# Patient Record
Sex: Male | Born: 1970 | Hispanic: Yes | Smoking: Never smoker
Health system: Southern US, Community
[De-identification: ages and names within clinical notes are randomized; demographics above are authoritative.]

## PROBLEM LIST (undated history)

## (undated) HISTORY — PX: NO PAST SURGERIES: SHX2092

---

## 2015-03-14 ENCOUNTER — Observation Stay (HOSPITAL_BASED_OUTPATIENT_CLINIC_OR_DEPARTMENT_OTHER)
Admission: EM | Admit: 2015-03-14 | Discharge: 2015-03-15 | Disposition: A | Discharge status: Home / Self Care | Payer: Self-pay | Source: Home / Self Care | Attending: Emergency Medicine | Admitting: Emergency Medicine

## 2015-03-14 ENCOUNTER — Emergency Department (HOSPITAL_COMMUNITY): Payer: Self-pay

## 2015-03-14 ENCOUNTER — Encounter (HOSPITAL_COMMUNITY): Payer: Self-pay | Admitting: Nurse Practitioner

## 2015-03-14 DIAGNOSIS — R0789 Other chest pain: Secondary | ICD-10-CM | POA: Insufficient documentation

## 2015-03-14 DIAGNOSIS — R9431 Abnormal electrocardiogram [ECG] [EKG]: Secondary | ICD-10-CM | POA: Insufficient documentation

## 2015-03-14 DIAGNOSIS — Z791 Long term (current) use of non-steroidal anti-inflammatories (NSAID): Secondary | ICD-10-CM

## 2015-03-14 DIAGNOSIS — R079 Chest pain, unspecified: Secondary | ICD-10-CM | POA: Diagnosis present

## 2015-03-14 DIAGNOSIS — R030 Elevated blood-pressure reading, without diagnosis of hypertension: Secondary | ICD-10-CM

## 2015-03-14 DIAGNOSIS — Z7982 Long term (current) use of aspirin: Secondary | ICD-10-CM

## 2015-03-14 LAB — CBC
HCT: 44.5 % (ref 39.0–52.0)
Hemoglobin: 15.3 g/dL (ref 13.0–17.0)
MCH: 31.4 pg (ref 26.0–34.0)
MCHC: 34.4 g/dL (ref 30.0–36.0)
MCV: 91.2 fL (ref 78.0–100.0)
PLATELETS: 293 10*3/uL (ref 150–400)
RBC: 4.88 MIL/uL (ref 4.22–5.81)
RDW: 12.9 % (ref 11.5–15.5)
WBC: 6.8 10*3/uL (ref 4.0–10.5)

## 2015-03-14 LAB — BASIC METABOLIC PANEL
Anion gap: 6 (ref 5–15)
BUN: 15 mg/dL (ref 6–20)
CALCIUM: 9 mg/dL (ref 8.9–10.3)
CO2: 27 mmol/L (ref 22–32)
CREATININE: 1.06 mg/dL (ref 0.61–1.24)
Chloride: 104 mmol/L (ref 101–111)
GFR calc Af Amer: >60 mL/min (ref >60)
GFR calc non Af Amer: >60 mL/min (ref >60)
GLUCOSE: 178 mg/dL — AB (ref 65–99)
Potassium: 3.4 mmol/L — ABNORMAL LOW (ref 3.5–5.1)
Sodium: 137 mmol/L (ref 135–145)

## 2015-03-14 LAB — I-STAT TROPONIN, ED: TROPONIN I, POC: 0 ng/mL (ref 0.00–0.08)

## 2015-03-14 MED ORDER — ASPIRIN 81 MG PO CHEW
324.0000 mg | CHEWABLE_TABLET | Freq: Once | ORAL | Status: AC
  Administered 2015-03-14: 324 mg via ORAL
  Filled 2015-03-14: qty 4

## 2015-03-14 NOTE — ED Notes (Signed)
Pt presents with c/o of chest pain 4/10 that he describes at tightness and radiating to left arm. Onset is one week ago, recurrent today it started around 1130hrs, states he is mildly nauseated. Denies any medical hx. History obtained with help of sister at bedside as pt speaks minimal [place].

## 2015-03-14 NOTE — ED Provider Notes (Signed)
CSN: [id number]   Arrival date & time 03/14/15 2133  History  This chart was scribed for Garrett Racer, MD by Bethel Born, ED Scribe. This patient was seen in room WA05/WA05 and the patient's care was started at 11:11 PM.  Chief Complaint  Patient presents with  . Chest Pain    HPI The history is provided by the patient. No language interpreter was used.   Garrett Reid is a 44 y.o. male who presents to the Emergency Department complaining of intermittent left-sided chest pain with gradual onset 1 week ago. Today he had an episode that started around lunch time but was relieved after OTC medication. The pain returned around 8 PM. He describes the pain as tightness and rates it 4/10 in severity. Pt is pain-free at present. Associated symptoms include tingling in the left arm, lightheadedness, and nausea.  In the past he has had similar symptoms after working outside. Pt denies SOB. No personal or known family history of cardiac disease. Pt denies tobacco use. No PCP.   History reviewed. No pertinent past medical history.  History reviewed. No pertinent past surgical history.  Family History  Problem Relation Age of Onset  . Hypertension Mother   . Diabetes Mother     Social History  Substance Use Topics  . Smoking status: Never Smoker   . Smokeless tobacco: Never Used  . Alcohol Use: No     Review of Systems  Constitutional: Negative for fever and chills.  Respiratory: Negative for shortness of breath.   Cardiovascular: Positive for chest pain. Negative for palpitations and leg swelling.  Gastrointestinal: Positive for nausea. Negative for vomiting and abdominal pain.  Musculoskeletal: Negative for back pain, neck pain and neck stiffness.  Skin: Negative for rash and wound.  Neurological: Positive for numbness. Negative for dizziness, weakness, light-headedness and headaches.  All other systems reviewed and are negative.   Home Medications   Prior to Admission medications    Medication Sig Start Date End Date Taking? Authorizing Provider  Aspirin-Salicylamide-Caffeine (BC HEADACHE POWDER PO) Take 1 packet by mouth every 4 (four) hours as needed (pain).   Yes Historical Provider, MD  guaiFENesin (ROBITUSSIN) 100 MG/5ML SOLN Take 15 mLs by mouth every 4 (four) hours as needed for cough or to loosen phlegm.   Yes Historical Provider, MD  ibuprofen (ADVIL,MOTRIN) 200 MG tablet Take 200 mg by mouth every 6 (six) hours as needed for moderate pain.   Yes Historical Provider, MD    Allergies  Review of patient's allergies indicates no known allergies.  Triage Vitals: BP 156/86 mmHg  Pulse 76  Temp(Src) 98.1 F (36.7 C) (Oral)  Resp 14  SpO2 97%  Physical Exam  Constitutional: He is oriented to person, place, and time. He appears well-developed and well-nourished. No distress.  HENT:  Head: Normocephalic and atraumatic.  Mouth/Throat: Oropharynx is clear and moist.  Eyes: EOM are normal. Pupils are equal, round, and reactive to light.  Neck: Normal range of motion. Neck supple.  Cardiovascular: Normal rate and regular rhythm.   Pulmonary/Chest: Effort normal and breath sounds normal. No respiratory distress. He has no wheezes. He has no rales. He exhibits no tenderness.  Abdominal: Soft. Bowel sounds are normal. He exhibits no distension and no mass. There is no tenderness. There is no rebound and no guarding.  Musculoskeletal: Normal range of motion. He exhibits no edema or tenderness.  No calf swelling or tenderness. Negative Tinel's and Phalen's sign. Distal pulses intact.  Neurological: He is alert  and oriented to person, place, and time.  Moves all extremities without deficit. Sensation is fully intact.  Skin: Skin is warm and dry. No rash noted. No erythema.  Psychiatric: He has a normal mood and affect. His behavior is normal.  Nursing note and vitals reviewed.   ED Course  Procedures   DIAGNOSTIC STUDIES: Oxygen Saturation is 97% on RA, normal by  my interpretation.    COORDINATION OF CARE: 11:15 PM Discussed treatment plan which includes CXR, EKG, lab work, aspirin, and admission to the hospital with pt at bedside and pt agreed to plan.  12:02 AM-Consult complete with Dr. Allena Katz (Hospitalist). Patient case explained and discussed. Dr. Allena Katz agrees to admit patient for further evaluation and treatment. Call ended at 12:04 AM   Labs Reviewed  BASIC METABOLIC PANEL - Abnormal; Notable for the following:    Potassium 3.4 (*)    Glucose, Bld 178 (*)    All other components within normal limits  COMPREHENSIVE METABOLIC PANEL - Abnormal; Notable for the following:    Glucose, Bld 109 (*)    Calcium 8.8 (*)    All other components within normal limits  CBC  CBC WITH DIFFERENTIAL/PLATELET  TROPONIN I  TROPONIN I  TROPONIN I  I-STAT TROPOININ, ED    I, Garrett Racer, MD, personally reviewed and evaluated these images and lab results as part of my medical decision-making.  Imaging Review Dg Chest 2 View  03/14/2015   CLINICAL DATA:  Acute onset of chest tightness and left arm pain. Initial encounter.  EXAM: CHEST  2 VIEW  COMPARISON:  None.  FINDINGS: The lungs are well-aerated and clear. There is no evidence of focal opacification, pleural effusion or pneumothorax.  The heart is normal in size; the mediastinal contour is within normal limits. No acute osseous abnormalities are seen.  IMPRESSION: No acute cardiopulmonary process seen.   Electronically Signed   By: Roanna Raider M.D.   On: 03/14/2015 22:26    EKG Interpretation  Date/Time:  Wednesday March 14 2015 21:41:02 EDT Ventricular Rate:  75 PR Interval:  149 QRS Duration: 106 QT Interval:  360 QTC Calculation: 402 R Axis:   36 Text Interpretation:  Sinus rhythm ST elev, probable normal early repol pattern No old tracing to compare Confirmed by [facility]  MD, ELLIOTT [phone]) on 03/14/2015 10:51:37 PM Also confirmed by Ranae Palms  MD, Laneta Guerin ([id number])  on 03/14/2015 11:03:03  PM       MDM   Final diagnoses:  Chest pain, unspecified chest pain type     I personally performed the services described in this documentation, which was scribed in my presence. The recorded information has been reviewed and is accurate.  Patient remains symptom-free in the emergency department. Discuss with [facility] and will admit for chest pain rule out.   Garrett Racer, MD 03/15/15 [phone]

## 2015-03-15 ENCOUNTER — Ambulatory Visit (HOSPITAL_COMMUNITY): Payer: Self-pay | Attending: Physician Assistant

## 2015-03-15 ENCOUNTER — Observation Stay (HOSPITAL_BASED_OUTPATIENT_CLINIC_OR_DEPARTMENT_OTHER): Payer: Self-pay

## 2015-03-15 ENCOUNTER — Ambulatory Visit (HOSPITAL_COMMUNITY): Payer: Self-pay

## 2015-03-15 ENCOUNTER — Encounter (HOSPITAL_COMMUNITY): Payer: Self-pay | Admitting: Internal Medicine

## 2015-03-15 DIAGNOSIS — R079 Chest pain, unspecified: Secondary | ICD-10-CM | POA: Insufficient documentation

## 2015-03-15 DIAGNOSIS — R9431 Abnormal electrocardiogram [ECG] [EKG]: Secondary | ICD-10-CM

## 2015-03-15 LAB — CBC WITH DIFFERENTIAL/PLATELET
Basophils Absolute: 0 10*3/uL (ref 0.0–0.1)
Basophils Relative: 1 % (ref 0–1)
EOS ABS: 0.1 10*3/uL (ref 0.0–0.7)
EOS PCT: 2 % (ref 0–5)
HCT: 42.8 % (ref 39.0–52.0)
Hemoglobin: 14.7 g/dL (ref 13.0–17.0)
LYMPHS ABS: 2.3 10*3/uL (ref 0.7–4.0)
LYMPHS PCT: 39 % (ref 12–46)
MCH: 31.4 pg (ref 26.0–34.0)
MCHC: 34.3 g/dL (ref 30.0–36.0)
MCV: 91.5 fL (ref 78.0–100.0)
MONO ABS: 0.6 10*3/uL (ref 0.1–1.0)
Monocytes Relative: 11 % (ref 3–12)
Neutro Abs: 2.8 10*3/uL (ref 1.7–7.7)
Neutrophils Relative %: 47 % (ref 43–77)
PLATELETS: 272 10*3/uL (ref 150–400)
RBC: 4.68 MIL/uL (ref 4.22–5.81)
RDW: 13 % (ref 11.5–15.5)
WBC: 5.8 10*3/uL (ref 4.0–10.5)

## 2015-03-15 LAB — NM MYOCAR MULTI W/SPECT W/WALL MOTION / EF
CHL CUP NUCLEAR SDS: 2
CHL CUP RESTING HR STRESS: 73 {beats}/min
CHL RATE OF PERCEIVED EXERTION: 13
CSEPED: 8 min
CSEPEW: 9.7 METS
CSEPHR: 92 %
Exercise duration (sec): 0 s
LHR: 0.31
LV dias vol: 117 mL
LV sys vol: 48 mL
MPHR: 178 {beats}/min
Peak HR: 164 {beats}/min
SRS: 1
SSS: 3
TID: 0.88

## 2015-03-15 LAB — COMPREHENSIVE METABOLIC PANEL
ALT: 30 U/L (ref 17–63)
ANION GAP: 5 (ref 5–15)
AST: 24 U/L (ref 15–41)
Albumin: 4.1 g/dL (ref 3.5–5.0)
Alkaline Phosphatase: 64 U/L (ref 38–126)
BUN: 14 mg/dL (ref 6–20)
CHLORIDE: 106 mmol/L (ref 101–111)
CO2: 27 mmol/L (ref 22–32)
CREATININE: 0.85 mg/dL (ref 0.61–1.24)
Calcium: 8.8 mg/dL — ABNORMAL LOW (ref 8.9–10.3)
Glucose, Bld: 109 mg/dL — ABNORMAL HIGH (ref 65–99)
POTASSIUM: 3.9 mmol/L (ref 3.5–5.1)
Sodium: 138 mmol/L (ref 135–145)
Total Bilirubin: 0.7 mg/dL (ref 0.3–1.2)
Total Protein: 7.6 g/dL (ref 6.5–8.1)

## 2015-03-15 LAB — TROPONIN I

## 2015-03-15 MED ORDER — PANTOPRAZOLE SODIUM 40 MG PO TBEC: 40.0000 mg | DELAYED_RELEASE_TABLET | Freq: Every day | ORAL | Status: AC

## 2015-03-15 MED ORDER — ENOXAPARIN SODIUM 40 MG/0.4ML ~~LOC~~ SOLN
40.0000 mg | SUBCUTANEOUS | Status: DC
  Administered 2015-03-15: 40 mg via SUBCUTANEOUS
  Filled 2015-03-15: qty 0.4

## 2015-03-15 MED ORDER — MORPHINE SULFATE (PF) 2 MG/ML IV SOLN: 2.0000 mg | INTRAVENOUS | Status: DC | PRN

## 2015-03-15 MED ORDER — ASPIRIN EC 325 MG PO TBEC
325.0000 mg | DELAYED_RELEASE_TABLET | Freq: Every day | ORAL | Status: DC
  Administered 2015-03-15: 325 mg via ORAL
  Filled 2015-03-15: qty 1

## 2015-03-15 MED ORDER — METOPROLOL TARTRATE 25 MG PO TABS: 25.0000 mg | ORAL_TABLET | Freq: Two times a day (BID) | ORAL | Status: DC

## 2015-03-15 MED ORDER — ACETAMINOPHEN 325 MG PO TABS: 650.0000 mg | ORAL_TABLET | ORAL | Status: DC | PRN

## 2015-03-15 MED ORDER — METOPROLOL TARTRATE 25 MG PO TABS: 25.0000 mg | ORAL_TABLET | Freq: Two times a day (BID) | ORAL | Status: AC

## 2015-03-15 MED ORDER — TECHNETIUM TC 99M SESTAMIBI GENERIC - CARDIOLITE
30.0000 | Freq: Once | INTRAVENOUS | Status: AC | PRN
  Administered 2015-03-15: 30 via INTRAVENOUS

## 2015-03-15 MED ORDER — TECHNETIUM TC 99M SESTAMIBI GENERIC - CARDIOLITE
10.0000 | Freq: Once | INTRAVENOUS | Status: AC | PRN
  Administered 2015-03-15: 10 via INTRAVENOUS

## 2015-03-15 MED ORDER — ONDANSETRON HCL 4 MG/2ML IJ SOLN
4.0000 mg | Freq: Four times a day (QID) | INTRAMUSCULAR | Status: DC | PRN
Start: 2015-03-15 — End: 2015-03-15

## 2015-03-15 NOTE — Discharge Summary (Signed)
Physician Discharge Summary  Garrett Reid UJW:[id number] DOB: July 03, 1971 DOA: 03/14/2015  PCP: No primary care provider on file.  Admit date: 03/14/2015 Discharge date: 03/15/2015  Time spent: 25 minutes  Recommendations for Outpatient Follow-up:  1. Follow up with community health in one week.   Discharge Diagnoses:  Principal Problem:   Atypical Chest pain Active Problems:   Abnormal EKG   Discharge Condition: improved   Diet recommendation: regular diet.   Filed Weights   03/15/15 0103  Weight: 115.2 kg (253 lb 15.5 oz)    History of present illness:  Garrett Reid is a 44 y.o. male with no significant Past medical history presented  with complaint of chest pain and left arm tightness for the  3-4 days. He was admitted to the hospitalist service for evaluation of chest pain.   Hospital Course:  Chest pain atypical non cardiac, and has resolved.  Enzymes have been negative.  Nuclear stress does not show any ischemia and echo was not significant for any abnormalities.  He was discharged on metoprolol for high blood pressure and protonix for possible GERD symptoms.  He was recommended to follow up with community health in one week.   Procedures:  Nuclear stress test.   Consultations:  cardiology  Discharge Exam: Filed Vitals:   03/15/15 1345  BP: 131/82  Pulse: 67  Temp: 98.3 F (36.8 C)  Resp: 20    General: alert afebrile comfortable Cardiovascular: s1s2 Respiratory: ctab  Discharge Instructions   Discharge Instructions    Diet - low sodium heart healthy    Complete by:  As directed      Discharge instructions    Complete by:  As directed   Follow up with PCP in 1 to 2 weeks for post hospitalization visit.          Current Discharge Medication List    START taking these medications   Details  metoprolol tartrate (LOPRESSOR) 25 MG tablet Take 1 tablet (25 mg total) by mouth 2 (two) times daily. Qty: 60 tablet, Refills: 0    pantoprazole (PROTONIX)  40 MG tablet Take 1 tablet (40 mg total) by mouth daily. Qty: 7 tablet, Refills: 0      STOP taking these medications     Aspirin-Salicylamide-Caffeine (BC HEADACHE POWDER PO)      guaiFENesin (ROBITUSSIN) 100 MG/5ML SOLN      ibuprofen (ADVIL,MOTRIN) 200 MG tablet        No Known Allergies Follow-up Information    Follow up with [facility]    . Schedule an appointment as soon as possible for a visit in 1 week.   Contact information:   [address] [place] [address] [phone]       The results of significant diagnostics from this hospitalization (including imaging, microbiology, ancillary and laboratory) are listed below for reference.    Significant Diagnostic Studies: Dg Chest 2 View  03/14/2015   CLINICAL DATA:  Acute onset of chest tightness and left arm pain. Initial encounter.  EXAM: CHEST  2 VIEW  COMPARISON:  None.  FINDINGS: The lungs are well-aerated and clear. There is no evidence of focal opacification, pleural effusion or pneumothorax.  The heart is normal in size; the mediastinal contour is within normal limits. No acute osseous abnormalities are seen.  IMPRESSION: No acute cardiopulmonary process seen.   Electronically Signed   By: Roanna Raider M.D.   On: 03/14/2015 22:26   Nm Myocar Multi W/spect W/wall  Motion / Ef  03/15/2015    There was no ST segment deviation noted during stress.  No T wave inversion was noted during stress.  Defect 1: There is a small in size, moderate in severity, fixed defect  present in the basal inferolateral and mid inferolateral location. No  ischemia noted.  This is a low risk study.  The left ventricular ejection fraction is normal (55-65%).  Nuclear stress EF: 55%.     Microbiology: No results found for this or any previous visit (from the past 240 hour(s)).   Labs: Basic Metabolic Panel:  Recent Labs Lab 03/14/15 2213 03/15/15 0216  NA 137 138  K 3.4* 3.9   CL 104 106  CO2 27 27  GLUCOSE 178* 109*  BUN 15 14  CREATININE 1.06 0.85  CALCIUM 9.0 8.8*   Liver Function Tests:  Recent Labs Lab 03/15/15 0216  AST 24  ALT 30  ALKPHOS 64  BILITOT 0.7  PROT 7.6  ALBUMIN 4.1   No results for input(s): LIPASE, AMYLASE in the last 168 hours. No results for input(s): AMMONIA in the last 168 hours. CBC:  Recent Labs Lab 03/14/15 2213 03/15/15 0216  WBC 6.8 5.8  NEUTROABS  --  2.8  HGB 15.3 14.7  HCT 44.5 42.8  MCV 91.2 91.5  PLT 293 272   Cardiac Enzymes:  Recent Labs Lab 03/15/15 0216 03/15/15 0828 03/15/15 1450  TROPONINI <0.03 <0.03 <0.03   BNP: BNP (last 3 results) No results for input(s): BNP in the last 8760 hours.  ProBNP (last 3 results) No results for input(s): PROBNP in the last 8760 hours.  CBG: No results for input(s): GLUCAP in the last 168 hours.     SignedKathlen Mody  [facility] 03/15/2015, 6:46 PM

## 2015-03-15 NOTE — Progress Notes (Signed)
Pt's nuc study stable, reviewed with Dr. Patty Sermons no further ischemic work up.  Follow up with PCP. I have notified pt.

## 2015-03-15 NOTE — H&P (Signed)
[facility] History and Physical  Patient: Garrett Reid  MRN: [id number]  DOB: [birth year]  DOS: the patient was seen and examined on 03/15/2015 PCP: No primary care provider on file.  Referring physician: Dr. Ranae Palms Chief Complaint: Chest pain  HPI: Garrett Reid is a 44 y.o. male with no significant Past medical history.  the patient is presenting with complaint of chest pain. He also has some left arm tightness. This is ongoing since last 3-4 days. The pain was felt like more tightness and was resolved on its own within 20 minutes. The patient has no section with exertion. Patient has associated with dizziness and lightheadedness. Denies having any shortness of breath orthopnea PND. He has chronic minor leg swelling. He denies having any complains of similar chest pain in the past and no prior cardiac workup. Patient does not see a physician on a regular basis. Denies any family history of any cardiac history. He denies having any diarrhea constipation burning urination recent travel or recent surgery. Does not take any medication. Denies having any drug abuse alcohol abuse or smoking  The patient is coming from home.  At his baseline ambulates without any support  And is independent for most of his ADL manages his medication on his own.  Review of Systems: as mentioned in the history of present illness.  A comprehensive review of the other systems is negative.  History reviewed. No pertinent past medical history. History reviewed. No pertinent past surgical history. Social History:  reports that he has never smoked. He has never used smokeless tobacco. He reports that he does not drink alcohol. His drug history is not on file.  No Known Allergies  Family History  Problem Relation Age of Onset  . Hypertension Mother   . Diabetes Mother     Prior to Admission medications   Medication Sig Start Date End Date Taking? Authorizing Provider  Aspirin-Salicylamide-Caffeine  (BC HEADACHE POWDER PO) Take 1 packet by mouth every 4 (four) hours as needed (pain).   Yes Historical Provider, MD  guaiFENesin (ROBITUSSIN) 100 MG/5ML SOLN Take 15 mLs by mouth every 4 (four) hours as needed for cough or to loosen phlegm.   Yes Historical Provider, MD  ibuprofen (ADVIL,MOTRIN) 200 MG tablet Take 200 mg by mouth every 6 (six) hours as needed for moderate pain.   Yes Historical Provider, MD    Physical Exam: Filed Vitals:   03/14/15 2136 03/15/15 0051 03/15/15 0103 03/15/15 0528  BP: 156/86 131/85 148/84 132/79  Pulse: 76 51 68 79  Temp: 98.1 F (36.7 C)  97.8 F (36.6 C) 97.6 F (36.4 C)  TempSrc: Oral  Oral Oral  Resp: Height:    (1.778 m)   Weight:   115.2 kg (253 lb 15.5 oz)   SpO2: 97% 97% 98% 98%    General: Alert, Awake and Oriented to Time, Place and Person. Appear in mild distress Eyes: PERRL ENT: Oral Mucosa clear moist. Neck: no JVD Cardiovascular: S1 and S2 Present, no Murmur, Peripheral Pulses Present Respiratory: Bilateral Air entry equal and Decreased,  Clear to Auscultation, no Crackles, no wheezes Abdomen: Bowel Sound [present, Soft and nono tenderness Skin: no Rash Extremities: no Pedal edema, no calf tenderness Neurologic: Grossly no focal neuro deficit.  Labs on Admission:  CBC:  Recent Labs Lab 03/14/15 2213 03/15/15 0216  WBC 6.8 5.8  NEUTROABS  --  2.8  HGB 15.3 14.7  HCT 44.5 42.8  MCV 91.2 91.5  PLT  293 272    CMP     Component Value Date/Time   NA 138 03/15/2015 0216   K 3.9 03/15/2015 0216   CL 106 03/15/2015 0216   CO2 27 03/15/2015 0216   GLUCOSE 109* 03/15/2015 0216   BUN 14 03/15/2015 0216   CREATININE 0.85 03/15/2015 0216   CALCIUM 8.8* 03/15/2015 0216   PROT 7.6 03/15/2015 0216   ALBUMIN 4.1 03/15/2015 0216   AST 24 03/15/2015 0216   ALT 30 03/15/2015 0216   ALKPHOS 64 03/15/2015 0216   BILITOT 0.7 03/15/2015 0216   GFRNONAA >60 03/15/2015 0216   GFRAA >60 03/15/2015 0216    No  results for input(s): LIPASE, AMYLASE in the last 168 hours.   Recent Labs Lab 03/15/15 0216  TROPONINI <0.03   BNP (last 3 results) No results for input(s): BNP in the last 8760 hours.  ProBNP (last 3 results) No results for input(s): PROBNP in the last 8760 hours.   Radiological Exams on Admission: Dg Chest 2 View  03/14/2015   CLINICAL DATA:  Acute onset of chest tightness and left arm pain. Initial encounter.  EXAM: CHEST  2 VIEW  COMPARISON:  None.  FINDINGS: The lungs are well-aerated and clear. There is no evidence of focal opacification, pleural effusion or pneumothorax.  The heart is normal in size; the mediastinal contour is within normal limits. No acute osseous abnormalities are seen.  IMPRESSION: No acute cardiopulmonary process seen.   Electronically Signed   By: Roanna Raider M.D.   On: 03/14/2015 22:26   EKG: Independently reviewed. normal sinus rhythm, nonspecific ST and T waves changes.  Assessment/Plan Principal Problem:   Chest pain Active Problems:   Abnormal EKG   1. Chest pain The patient is presenting with numbness of chest pain. EKG shows that he has T wave inversions in lead 3 as well as a other nonspecific EKG changes not suggesting any acute ischemia. Chest x-ray is clear. The patient's chest pain improved with nitroglycerin but her Currently has received aspirin. At this point despite having no significant risk factors the patient will be admitted in the hospital for rule out a CVA due to his history as well as EKG changes. Monitor serial troponin. Echogram in the morning. We'll discuss with cardiology in the morning. Continue with aspirin and Lopressor.  Advance goals of care discussion: full code   Consults: cardiology  DVT Prophylaxis: subcutaneous Heparin Nutrition: Nothing by mouth after midnight   Family Communication: family was present at bedside, opportunity was given to ask question and all questions were answered satisfactorily  at the time of interview. Disposition: Admitted as observation, telemetry unit.  Author: Lynden Oxford, MD [facility] Pager: [phone] 03/15/2015  If 7PM-7AM, please contact night-coverage [email or web address] Password [email or web address]

## 2015-03-15 NOTE — Progress Notes (Signed)
exercise stress test completed without complications , nuc results to follow.

## 2015-03-15 NOTE — Consult Note (Signed)
CARDIOLOGY CONSULT NOTE   Patient ID: Garrett Reid MRN: [id number], DOB/AGE: [birth year], 44   Admit date: 03/14/2015 Date of Consult: 03/15/2015   Primary Physician: No primary care provider on file. Primary Cardiologist: new  Pt. Profile  Garrett Reid is 44 year old Spanish-speaking male with no past medical history or cardiac history presented with 1 week h/o intermittent chest pain  Problem List  History reviewed. No pertinent past medical history.  Past Surgical History  Procedure Laterality Date  . No past surgeries       Allergies  No Known Allergies  HPI   Garrett Reid is 44 year old Spanish-speaking male with no past medical history or cardiac history. He has not seen a primary care physician since 1997 when he had a annual physical. He denies ever smoking before, he does not do any illicit drug and does not drink. He denies significant family history of CAD, although mother has hypertension hyperlipidemia and diabetes. 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   A month ago, his sister's son was sick, he also had some chills and a little cough, however this has resolved. For the past week, he has noted intermittent left-sided chest tightness/burning radiating down the left arm. He denies any obvious association with exertion, stating that the tightness occur both at rest and with exertion but more so at rest. It usually lasts about 20 minutes at a time and resolve by itself. A month ago while at work, he he had an episode of back pain radiating up the neck which he is unsure if related to the current symptom past week. Yesterday alone, he had 3 episode of chest discomfort all at rest and none with exertion. The first episode was in the morning. The second episode was around noon when he was eating lunch, he has some dizziness and diaphoretic at the time. The symptom improved after he took [facility] powder. The last episode occurred after he came home last night and  was sitting down watching TV around 8:00PM. This episode, however did not go away in 20 minutes as expected and actually lasted about an hour and a half to 2 hours until shortly after he arrived at [facility] ED. Since then, the chest discomfort has not recurred.  On arrival his creatinine is 1.06, CBC normal, troponin negative. Chest x-ray show no acute process. EKG showed nonspecific T wave changes, however no obvious ST elevation or depression. Cardiology has been constant for chest pain.   Inpatient Medications  . aspirin EC  325 mg Oral Daily  . enoxaparin (LOVENOX) injection  40 mg Subcutaneous Q24H  . metoprolol tartrate  25 mg Oral BID    Family History Family History  Problem Relation Age of Onset  . Hypertension Mother   . Diabetes Mother   . Hyperlipidemia Mother      Social History Social History   Social History  . Marital Status: Divorced    Spouse Name: N/A  . Number of Children: N/A  . Years of Education: N/A   Occupational History  . Not on file.   Social History Main Topics  . Smoking status: Never Smoker   . Smokeless tobacco: Never Used  . Alcohol Use: No  . Drug Use: No  . Sexual Activity: Not on file   Other Topics Concern  . Not on file   Social History Narrative     Review of Systems  General:  No chills, fever, night sweats  or weight changes.  Cardiovascular:  No dyspnea on exertion, edema, orthopnea, palpitations, paroxysmal nocturnal dyspnea. +chest pain radiating down L arm, diaphoresis Dermatological: No rash, lesions/masses Respiratory: No cough, dyspnea Urologic: No hematuria, dysuria Abdominal:   No nausea, vomiting, diarrhea, bright red blood per rectum, melena, or hematemesis Neurologic:  No visual changes, wkns, changes in mental status. All other systems reviewed and are otherwise negative except as noted above.  Physical Exam  Blood pressure 132/79, pulse 79, temperature 97.6 F (36.4 C), temperature source Oral, resp.  rate 20, height 5\' 10"  (1.778 m), weight 253 lb 15.5 oz (115.2 kg), SpO2 98 %.  General: Pleasant, NAD Psych: Normal affect. Neuro: Alert and oriented X 3. Moves all extremities spontaneously. HEENT: Normal  Neck: Supple without bruits or JVD. Lungs:  Resp regular and unlabored, CTA. Heart: RRR no s3, s4, or murmurs. Abdomen: Soft, non-tender, non-distended, BS + x 4.  Extremities: No clubbing, cyanosis or edema. DP/PT/Radials 2+ and equal bilaterally.  Labs   Recent Labs  03/15/15 0216  TROPONINI <0.03   Lab Results  Component Value Date   WBC 5.8 03/15/2015   HGB 14.7 03/15/2015   HCT 42.8 03/15/2015   MCV 91.5 03/15/2015   PLT 272 03/15/2015     Recent Labs Lab 03/15/15 0216  NA 138  K 3.9  CL 106  CO2 27  BUN 14  CREATININE 0.85  CALCIUM 8.8*  PROT 7.6  BILITOT 0.7  ALKPHOS 64  ALT 30  AST 24  GLUCOSE 109*   No results found for: CHOL, HDL, LDLCALC, TRIG No results found for: DDIMER  Radiology/Studies  Dg Chest 2 View  03/14/2015   CLINICAL DATA:  Acute onset of chest tightness and left arm pain. Initial encounter.  EXAM: CHEST  2 VIEW  COMPARISON:  None.  FINDINGS: The lungs are well-aerated and clear. There is no evidence of focal opacification, pleural effusion or pneumothorax.  The heart is normal in size; the mediastinal contour is within normal limits. No acute osseous abnormalities are seen.  IMPRESSION: No acute cardiopulmonary process seen.   Electronically Signed   By: Roanna Raider M.D.   On: 03/14/2015 22:26    ECG  NSR with TWI in lead III and aVR  ASSESSMENT AND PLAN  1. Chest pain  - serial trop negative, no FHx of CAD, never smoke before, no illicit drug use. However does not see a PCP regularly.   - unclear cause for chest pain, however cannot completely rule out unstable angina at this time  - discussed with MD, given reassuring trop and EKG, will obtain 1-day treadmill nuclear stress test this morning. Nurse to arrange  transfer  - if NUC positive, will need cath, if negative, he may be discharged to establish with a PCP  - Metoprolol was ordered however not given, instructed the nurse to hold it since it will be treadmill nuc   Signed, Azalee Course, PA-C 03/15/2015, 8:26 AM Patient was seen in the nuclear lab.  He had just finished his treadmill test. Tolerated exercise well. Exam unremarkable. Agree with assessment and plan above.

## 2015-03-15 NOTE — Progress Notes (Signed)
  Echocardiogram 2D Echocardiogram has been performed.  Garrett Reid M 03/15/2015, 3:50 PM

## 2015-03-15 NOTE — Progress Notes (Signed)
[facility] received consult from [address] west RN for pcp issues.  EDCM spoke to patient at bedside.  Per patient's discharge instructions, patient is to follow up with [facility] in one week.  EDCM advised patient to call [facility] first thing tomorrow  [facility] provide patient with pamphlet to [facility], informed patient of services there.  EDCM also provided patient with list of pcps who accept self pay patients, list of discount pharmacies and websites needymeds.org and GoodRX.com for medication assistance, phone number to inquire about the orange card, phone number to inquire about Mediciad, phone number to inquire about the Affordable Care Act, financial resources in the community such as local churches, [employer], urban ministries, and dental assistance for uninsured patients. Encouraged patient to apply for Medicaid. [facility] provided patient with coupon for protonix for discount rate at [facility], patient may also purchase lopressor at discount rate at [facility]. Patient thankful for resources.  No further EDCM needs at this time.
